# Patient Record
Sex: Male | Born: 1990 | Race: White | Hispanic: No | Marital: Single | Smoking: Never smoker
Health system: Southern US, Community
[De-identification: ages and names within clinical notes are randomized; demographics above are authoritative.]

---

## 2017-08-31 ENCOUNTER — Other Ambulatory Visit: Payer: Self-pay

## 2017-08-31 ENCOUNTER — Encounter (HOSPITAL_COMMUNITY): Payer: Self-pay

## 2017-08-31 ENCOUNTER — Ambulatory Visit (HOSPITAL_COMMUNITY)
Admission: EM | Admit: 2017-08-31 | Discharge: 2017-08-31 | Disposition: A | Discharge status: Home / Self Care | Payer: BLUE CROSS/BLUE SHIELD | Attending: Urgent Care | Admitting: Urgent Care

## 2017-08-31 DIAGNOSIS — R05 Cough: Secondary | ICD-10-CM

## 2017-08-31 DIAGNOSIS — R059 Cough, unspecified: Secondary | ICD-10-CM

## 2017-08-31 DIAGNOSIS — R0789 Other chest pain: Secondary | ICD-10-CM

## 2017-08-31 DIAGNOSIS — J22 Unspecified acute lower respiratory infection: Secondary | ICD-10-CM

## 2017-08-31 DIAGNOSIS — Z9109 Other allergy status, other than to drugs and biological substances: Secondary | ICD-10-CM

## 2017-08-31 DIAGNOSIS — R0602 Shortness of breath: Secondary | ICD-10-CM

## 2017-08-31 MED ORDER — ALBUTEROL SULFATE HFA 108 (90 BASE) MCG/ACT IN AERS: 1.0000 | INHALATION_SPRAY | Freq: Four times a day (QID) | RESPIRATORY_TRACT | 0 refills | Status: AC | PRN

## 2017-08-31 MED ORDER — BENZONATATE 100 MG PO CAPS: 100.0000 mg | ORAL_CAPSULE | Freq: Three times a day (TID) | ORAL | 0 refills | Status: AC | PRN

## 2017-08-31 MED ORDER — AZITHROMYCIN 250 MG PO TABS: ORAL_TABLET | ORAL | 0 refills | Status: AC

## 2017-08-31 NOTE — ED Provider Notes (Signed)
  MRN: [id number] DOB: 01/05/1991  Subjective:   Bradley Buchanan Banton is a 27 y.o. male presenting for 1 month history of worsening productive cough that elicits chest pain, shob. Has also had fatigue, subjective fever. Initially had symptoms of sinus congestion, drainage; thought it was allergy related and has been taking Claritin for this. Also takes Adderall, Nexium. Denies smoking cigarettes. Has No Known Allergies. Has a history of childhood asthma, reactive airway disease. PMH includes GERD, ADD. Denies past surgical history.  Objective:   Vitals: BP 133/74   Pulse 66   Temp 99.1 F (37.3 C)   Resp 18   Ht 6\' 3"  (1.905 m)   Wt 190 lb (86.2 kg)   SpO2 100%   BMI 23.75 kg/m   Physical Exam  Constitutional: He is oriented to person, place, and time. He appears well-developed and well-nourished.  HENT:  Mouth/Throat: Oropharynx is clear and moist.  Eyes: Right eye exhibits no discharge. Left eye exhibits no discharge. No scleral icterus.  Cardiovascular: Normal rate, regular rhythm and intact distal pulses. Exam reveals no gallop and no friction rub.  No murmur heard. Pulmonary/Chest: No respiratory distress. He has wheezes (and corse lung sound over right mid to lower lung fields). He has no rales.  Neurological: He is alert and oriented to person, place, and time.  Skin: Skin is warm and dry.   Assessment and Plan :   Lower respiratory infection  Cough  Atypical chest pain  Shortness of breath  Environmental allergies  Will cover for lower respiratory infection with Azithromycin. Schedule albuterol inhaler, use supportive care otherwise. Return-to-clinic precautions discussed, patient verbalized understanding.    Bradley Buchanan, Donnie Panik, [place] 08/31/17 1843

## 2017-08-31 NOTE — Discharge Instructions (Signed)
Hydrate well with at least 2 liters (1 gallon) of water daily. For sore throat try using a honey-based tea. Use 3 teaspoons of honey with juice squeezed from half lemon. Place shaved pieces of ginger into 1/2-1 cup of water and warm over stove top. Then mix the ingredients and repeat every 4 hours as needed.  °

## 2017-08-31 NOTE — ED Triage Notes (Signed)
Pt presents with complaints of productive cough x 1 month. Progressively worse.

## 2017-09-25 ENCOUNTER — Ambulatory Visit (HOSPITAL_COMMUNITY)
Admission: EM | Admit: 2017-09-25 | Discharge: 2017-09-25 | Disposition: A | Discharge status: Home / Self Care | Payer: BLUE CROSS/BLUE SHIELD | Attending: Family Medicine | Admitting: Family Medicine

## 2017-09-25 ENCOUNTER — Ambulatory Visit (INDEPENDENT_AMBULATORY_CARE_PROVIDER_SITE_OTHER): Payer: BLUE CROSS/BLUE SHIELD

## 2017-09-25 ENCOUNTER — Encounter (HOSPITAL_COMMUNITY): Payer: Self-pay | Admitting: *Deleted

## 2017-09-25 DIAGNOSIS — M25561 Pain in right knee: Secondary | ICD-10-CM

## 2017-09-25 MED ORDER — IBUPROFEN 800 MG PO TABS: 800.0000 mg | ORAL_TABLET | Freq: Three times a day (TID) | ORAL | 0 refills | Status: AC

## 2017-09-25 NOTE — ED Triage Notes (Addendum)
Patient reports he was playing football yesterday and he jumped and landed on a metal object. States that he has swelling and pain. Has been icing and elevating. States unable to bend due to pain and swelling. States that he is able to walk on knee if he keeps it straight. States feels like a lot of pressure in the knee and states that his knee feels unstable. Patient states he tore his meniscus in 2013, no surgery, healed improperly on its own.

## 2017-09-25 NOTE — Discharge Instructions (Addendum)
Please follow up with orthopedics, information for [facility] [place].  Please use knee immobilizer and crutches as needed.  If symptoms improving you may return to weightbearing.  Please ice knee multiple times a day.  Use anti-inflammatories for pain/swelling. You may take up to 800 mg Ibuprofen every 8 hours with food. You may supplement Ibuprofen with Tylenol [phone] mg every 8 hours.
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
## 2018-03-25 ENCOUNTER — Emergency Department (HOSPITAL_COMMUNITY): Payer: No Typology Code available for payment source

## 2018-03-25 ENCOUNTER — Emergency Department (HOSPITAL_COMMUNITY)
Admission: EM | Admit: 2018-03-25 | Discharge: 2018-03-25 | Disposition: A | Discharge status: Home / Self Care | Payer: No Typology Code available for payment source | Attending: Emergency Medicine | Admitting: Emergency Medicine

## 2018-03-25 DIAGNOSIS — Y92410 Unspecified street and highway as the place of occurrence of the external cause: Secondary | ICD-10-CM | POA: Insufficient documentation

## 2018-03-25 DIAGNOSIS — S20211A Contusion of right front wall of thorax, initial encounter: Secondary | ICD-10-CM | POA: Diagnosis not present

## 2018-03-25 DIAGNOSIS — Y998 Other external cause status: Secondary | ICD-10-CM | POA: Diagnosis not present

## 2018-03-25 DIAGNOSIS — R51 Headache: Secondary | ICD-10-CM | POA: Insufficient documentation

## 2018-03-25 DIAGNOSIS — Y939 Activity, unspecified: Secondary | ICD-10-CM | POA: Diagnosis not present

## 2018-03-25 DIAGNOSIS — S8011XA Contusion of right lower leg, initial encounter: Secondary | ICD-10-CM | POA: Insufficient documentation

## 2018-03-25 DIAGNOSIS — S299XXA Unspecified injury of thorax, initial encounter: Secondary | ICD-10-CM | POA: Diagnosis present

## 2018-03-25 MED ORDER — IBUPROFEN 600 MG PO TABS: 600.0000 mg | ORAL_TABLET | Freq: Four times a day (QID) | ORAL | 0 refills | Status: AC | PRN

## 2018-03-25 MED ORDER — HYDROCODONE-ACETAMINOPHEN 5-325 MG PO TABS: 1.0000 | ORAL_TABLET | ORAL | 0 refills | Status: AC | PRN

## 2018-03-25 NOTE — ED Notes (Signed)
Patient given discharge instructions and verbalized understanding.  Patient stable to discharge at this time.  Patient is alert and oriented to baseline.  No distressed noted at this time.  All belongings taken with the patient at discharge.   

## 2018-03-25 NOTE — Progress Notes (Signed)
I responded to a Level 2 page from the ED (D32). I arrived at the room and talked with the patient's girlfriend. The patient was not available and being treated by the medical team. The patient's girlfriend said that the patient's mother had be notified that he was in the ED. I shared that the Chaplain is available for additional support as needed or requested.    03/25/18 1900  Clinical Encounter Type  Visited With Patient not available  Visit Type Spiritual support;ED  Referral From Nurse  Consult/Referral To Chaplain     Chaplain Dr Melvyn Novas
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
